# Patient Record
Sex: Female | Born: 1990 | Race: Black or African American | Hispanic: No | Marital: Single | Smoking: Never smoker
Health system: Southern US, Community
[De-identification: ages and names within clinical notes are randomized; demographics above are authoritative.]

---
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
## 2009-04-04 ENCOUNTER — Emergency Department (HOSPITAL_COMMUNITY): Admission: EM | Admit: 2009-04-04 | Discharge: 2009-04-04 | Payer: Self-pay | Admitting: Emergency Medicine

## 2009-07-20 ENCOUNTER — Ambulatory Visit: Payer: Self-pay | Admitting: Family Medicine

## 2009-07-20 ENCOUNTER — Inpatient Hospital Stay (HOSPITAL_COMMUNITY): Admission: AD | Admit: 2009-07-20 | Discharge: 2009-07-22 | Payer: Self-pay | Admitting: Radiology

## 2010-01-01 ENCOUNTER — Emergency Department (HOSPITAL_COMMUNITY): Admission: EM | Admit: 2010-01-01 | Discharge: 2010-01-01 | Payer: Self-pay | Admitting: Emergency Medicine

## 2010-11-01 LAB — COMPREHENSIVE METABOLIC PANEL
CO2: 21 mEq/L (ref 19–32)
Calcium: 8.3 mg/dL — ABNORMAL LOW (ref 8.4–10.5)
Chloride: 108 mEq/L (ref 96–112)
GFR calc Af Amer: >60 mL/min (ref >60)
GFR calc non Af Amer: >60 mL/min (ref >60)
Total Bilirubin: 0.4 mg/dL (ref 0.3–1.2)

## 2010-11-01 LAB — URINALYSIS, ROUTINE W REFLEX MICROSCOPIC
Bilirubin Urine: NEGATIVE
Glucose, UA: NEGATIVE mg/dL
Nitrite: NEGATIVE
Protein, ur: NEGATIVE mg/dL
Urobilinogen, UA: 0.2 mg/dL (ref 0.0–1.0)

## 2010-11-01 LAB — CBC
HCT: 29.9 % — ABNORMAL LOW (ref 36.0–46.0)
HCT: 32.3 % — ABNORMAL LOW (ref 36.0–46.0)
HCT: 32.4 % — ABNORMAL LOW (ref 36.0–46.0)
Hemoglobin: 10.3 g/dL — ABNORMAL LOW (ref 12.0–15.0)
Hemoglobin: 10.5 g/dL — ABNORMAL LOW (ref 12.0–15.0)
Hemoglobin: 9.7 g/dL — ABNORMAL LOW (ref 12.0–15.0)
MCHC: 32 g/dL (ref 30.0–36.0)
MCHC: 32.3 g/dL (ref 30.0–36.0)
MCHC: 32.5 g/dL (ref 30.0–36.0)
MCV: 73.6 fL — ABNORMAL LOW (ref 78.0–100.0)
MCV: 74.3 fL — ABNORMAL LOW (ref 78.0–100.0)
MCV: 74.4 fL — ABNORMAL LOW (ref 78.0–100.0)
Platelets: 232 10*3/uL (ref 150–400)
RBC: 4.35 MIL/uL (ref 3.87–5.11)
RBC: 4.4 MIL/uL (ref 3.87–5.11)
RDW: 15.4 % (ref 11.5–15.5)
RDW: 15.5 % (ref 11.5–15.5)
RDW: 15.6 % — ABNORMAL HIGH (ref 11.5–15.5)
WBC: 9.8 10*3/uL (ref 4.0–10.5)

## 2010-11-01 LAB — DIFFERENTIAL
Basophils Absolute: 0 10*3/uL (ref 0.0–0.1)
Eosinophils Relative: 1 % (ref 0–5)
Lymphocytes Relative: 14 % (ref 12–46)
Lymphs Abs: 1.3 10*3/uL (ref 0.7–4.0)
Monocytes Relative: 9 % (ref 3–12)

## 2010-11-01 LAB — URINE MICROSCOPIC-ADD ON

## 2010-11-01 LAB — TYPE AND SCREEN: ABO/RH(D): O POS

## 2010-11-01 LAB — ABO/RH: ABO/RH(D): O POS

## 2010-11-01 LAB — RAPID URINE DRUG SCREEN, HOSP PERFORMED: Amphetamines: NOT DETECTED

## 2010-11-01 LAB — HIV ANTIBODY (ROUTINE TESTING W REFLEX): HIV: NONREACTIVE

## 2010-11-05 LAB — POCT URINALYSIS DIP (DEVICE)
Ketones, ur: NEGATIVE mg/dL
Nitrite: NEGATIVE
Protein, ur: NEGATIVE mg/dL
Specific Gravity, Urine: 1.01 (ref 1.005–1.030)
Urobilinogen, UA: 0.2 mg/dL (ref 0.0–1.0)
pH: 7 (ref 5.0–8.0)

## 2010-11-05 LAB — POCT PREGNANCY, URINE: Preg Test, Ur: POSITIVE

## 2011-04-28 LAB — CBC
HCT: 24.4 — ABNORMAL LOW
HCT: 26.7 — ABNORMAL LOW
Hemoglobin: 8.9 — ABNORMAL LOW
MCHC: 33.3
MCHC: 33.6
MCV: 83.4
MCV: 83.6
MCV: 83.7
Platelets: 311
RBC: 2.91 — ABNORMAL LOW
RBC: 3.2 — ABNORMAL LOW
RBC: 4.35
WBC: 10.1
WBC: 11.1

## 2011-04-28 LAB — DIFFERENTIAL
Basophils Absolute: 0.1
Eosinophils Relative: 2
Lymphocytes Relative: 18 — ABNORMAL LOW
Lymphs Abs: 2
Neutro Abs: 8
Neutrophils Relative %: 71

## 2011-04-28 LAB — COMPREHENSIVE METABOLIC PANEL
ALT: 23
AST: 20
AST: 52 — ABNORMAL HIGH
Albumin: 2.1 — ABNORMAL LOW
Alkaline Phosphatase: 160 — ABNORMAL HIGH
BUN: 1 — ABNORMAL LOW
BUN: 4 — ABNORMAL LOW
CO2: 25
CO2: 26
Calcium: 6.7 — ABNORMAL LOW
Calcium: 9.2
Chloride: 106
Chloride: 108
Chloride: 109
Creatinine, Ser: 0.54
Creatinine, Ser: 0.66
Glucose, Bld: 90
Glucose, Bld: 93
Potassium: 3.7
Potassium: 4.3
Sodium: 138
Total Bilirubin: 0.4
Total Bilirubin: 0.6
Total Protein: 6.9

## 2011-05-06 LAB — POCT PREGNANCY, URINE: Preg Test, Ur: NEGATIVE

## 2011-08-21 ENCOUNTER — Emergency Department (HOSPITAL_COMMUNITY): Payer: Self-pay

## 2011-08-21 ENCOUNTER — Encounter (HOSPITAL_COMMUNITY): Payer: Self-pay | Admitting: Emergency Medicine

## 2011-08-21 ENCOUNTER — Emergency Department (HOSPITAL_COMMUNITY)
Admission: EM | Admit: 2011-08-21 | Discharge: 2011-08-21 | Disposition: A | Discharge status: Home / Self Care | Payer: Self-pay | Attending: Emergency Medicine | Admitting: Emergency Medicine

## 2011-08-21 DIAGNOSIS — R070 Pain in throat: Secondary | ICD-10-CM | POA: Insufficient documentation

## 2011-08-21 DIAGNOSIS — R300 Dysuria: Secondary | ICD-10-CM | POA: Insufficient documentation

## 2011-08-21 DIAGNOSIS — R319 Hematuria, unspecified: Secondary | ICD-10-CM | POA: Insufficient documentation

## 2011-08-21 DIAGNOSIS — R509 Fever, unspecified: Secondary | ICD-10-CM | POA: Insufficient documentation

## 2011-08-21 DIAGNOSIS — N898 Other specified noninflammatory disorders of vagina: Secondary | ICD-10-CM | POA: Insufficient documentation

## 2011-08-21 DIAGNOSIS — A512 Primary syphilis of other sites: Secondary | ICD-10-CM | POA: Insufficient documentation

## 2011-08-21 DIAGNOSIS — R350 Frequency of micturition: Secondary | ICD-10-CM | POA: Insufficient documentation

## 2011-08-21 DIAGNOSIS — J3489 Other specified disorders of nose and nasal sinuses: Secondary | ICD-10-CM | POA: Insufficient documentation

## 2011-08-21 DIAGNOSIS — N72 Inflammatory disease of cervix uteri: Secondary | ICD-10-CM | POA: Insufficient documentation

## 2011-08-21 DIAGNOSIS — R112 Nausea with vomiting, unspecified: Secondary | ICD-10-CM | POA: Insufficient documentation

## 2011-08-21 DIAGNOSIS — R000 Tachycardia, unspecified: Secondary | ICD-10-CM | POA: Insufficient documentation

## 2011-08-21 LAB — WET PREP, GENITAL
Trich, Wet Prep: NONE SEEN
Yeast Wet Prep HPF POC: NONE SEEN

## 2011-08-21 LAB — URINALYSIS, ROUTINE W REFLEX MICROSCOPIC
Glucose, UA: NEGATIVE mg/dL
Protein, ur: NEGATIVE mg/dL
Specific Gravity, Urine: 1.012 (ref 1.005–1.030)
pH: 6 (ref 5.0–8.0)

## 2011-08-21 LAB — URINE MICROSCOPIC-ADD ON

## 2011-08-21 LAB — URINE CULTURE: Culture  Setup Time: 201301210227

## 2011-08-21 LAB — DIFFERENTIAL
Basophils Absolute: 0.1 10*3/uL (ref 0.0–0.1)
Basophils Relative: 1 % (ref 0–1)
Eosinophils Relative: 1 % (ref 0–5)
Monocytes Absolute: 1 10*3/uL (ref 0.1–1.0)

## 2011-08-21 LAB — COMPREHENSIVE METABOLIC PANEL
AST: 39 U/L — ABNORMAL HIGH (ref 0–37)
Albumin: 3.6 g/dL (ref 3.5–5.2)
Calcium: 8.9 mg/dL (ref 8.4–10.5)
Creatinine, Ser: 0.66 mg/dL (ref 0.50–1.10)
Total Protein: 8 g/dL (ref 6.0–8.3)

## 2011-08-21 LAB — CBC
HCT: 31.2 % — ABNORMAL LOW (ref 36.0–46.0)
MCHC: 33.3 g/dL (ref 30.0–36.0)
MCV: 77.4 fL — ABNORMAL LOW (ref 78.0–100.0)
Platelets: 239 10*3/uL (ref 150–400)
RDW: 13.3 % (ref 11.5–15.5)

## 2011-08-21 MED ORDER — OSELTAMIVIR PHOSPHATE 75 MG PO CAPS: 75.0000 mg | ORAL_CAPSULE | Freq: Every day | ORAL | Status: AC

## 2011-08-21 MED ORDER — PENICILLIN G BENZATHINE 1200000 UNIT/2ML IM SUSP
2.4000 10*6.[IU] | Freq: Once | INTRAMUSCULAR | Status: AC
  Administered 2011-08-21: 2.4 10*6.[IU] via INTRAMUSCULAR
  Filled 2011-08-21: qty 4

## 2011-08-21 MED ORDER — CEPHALEXIN 250 MG PO CAPS
500.0000 mg | ORAL_CAPSULE | Freq: Once | ORAL | Status: AC
  Administered 2011-08-21: 500 mg via ORAL
  Filled 2011-08-21: qty 2

## 2011-08-21 MED ORDER — AZITHROMYCIN 250 MG PO TABS
1000.0000 mg | ORAL_TABLET | Freq: Once | ORAL | Status: AC
  Administered 2011-08-21: 1000 mg via ORAL
  Filled 2011-08-21: qty 4

## 2011-08-21 MED ORDER — MORPHINE SULFATE 4 MG/ML IJ SOLN
4.0000 mg | Freq: Once | INTRAMUSCULAR | Status: AC
  Administered 2011-08-21: 4 mg via INTRAVENOUS
  Filled 2011-08-21: qty 1

## 2011-08-21 MED ORDER — OSELTAMIVIR PHOSPHATE 75 MG PO CAPS
75.0000 mg | ORAL_CAPSULE | ORAL | Status: AC
  Administered 2011-08-21: 75 mg via ORAL
  Filled 2011-08-21: qty 1

## 2011-08-21 MED ORDER — POTASSIUM CHLORIDE CRYS ER 20 MEQ PO TBCR
40.0000 meq | EXTENDED_RELEASE_TABLET | Freq: Once | ORAL | Status: AC
  Administered 2011-08-21: 40 meq via ORAL
  Filled 2011-08-21: qty 2

## 2011-08-21 MED ORDER — LIDOCAINE HCL (PF) 1 % IJ SOLN
INTRAMUSCULAR | Status: AC
  Filled 2011-08-21: qty 5

## 2011-08-21 MED ORDER — CEFTRIAXONE SODIUM 250 MG IJ SOLR
250.0000 mg | Freq: Once | INTRAMUSCULAR | Status: AC
  Administered 2011-08-21: 250 mg via INTRAMUSCULAR
  Filled 2011-08-21: qty 250

## 2011-08-21 MED ORDER — CEPHALEXIN 500 MG PO CAPS: 500.0000 mg | ORAL_CAPSULE | Freq: Three times a day (TID) | ORAL | Status: AC

## 2011-08-21 MED ORDER — ONDANSETRON 4 MG PO TBDP
ORAL_TABLET | ORAL | Status: AC
  Filled 2011-08-21: qty 2

## 2011-08-21 MED ORDER — ONDANSETRON 4 MG PO TBDP
8.0000 mg | ORAL_TABLET | Freq: Once | ORAL | Status: AC
  Administered 2011-08-21: 8 mg via ORAL

## 2011-08-21 MED ORDER — SODIUM CHLORIDE 0.9 % IV BOLUS (SEPSIS)
1000.0000 mL | Freq: Once | INTRAVENOUS | Status: AC
  Administered 2011-08-21: 1000 mL via INTRAVENOUS

## 2011-08-21 MED ORDER — POTASSIUM CHLORIDE CRYS ER 20 MEQ PO TBCR: 40.0000 meq | EXTENDED_RELEASE_TABLET | Freq: Every day | ORAL | Status: AC

## 2011-08-21 MED ORDER — METRONIDAZOLE 500 MG PO TABS
2000.0000 mg | ORAL_TABLET | Freq: Once | ORAL | Status: AC
  Administered 2011-08-21: 2000 mg via ORAL
  Filled 2011-08-21: qty 4

## 2011-08-21 NOTE — ED Provider Notes (Addendum)
History     CSN: [id number]  Arrival date & time 08/21/11  1749   First MD Initiated Contact with Patient 08/21/11 1924      Chief Complaint  Patient presents with  . Sore Throat    (Consider location/radiation/quality/duration/timing/severity/associated sxs/prior treatment) HPI Patient is a 21 year old female who presents today complaining of dysuria as well as sore throat and vaginal discharge. Patient is also had fevers and chills. She denies any cough or myalgias. Patient is tachycardic to 118 on presentation. She is afebrile here patient has had a temperature as high as 104 at home last night. Patient does mention that she could have possibility of a sexual transmitted disease. She also notes that she has noted bumps on her labia. Her symptoms began on Monday as far as her operative course symptoms. Blood in her urine, her vaginal lesions, and vaginal discharge began 2 days ago. Patient does not think she can be pregnant as she is on Depo.  She has not missed any of her periods. She denies any constipation or diarrhea. She has had nausea and an episode of vomiting. She denies any abdominal pain. Patient says her pain as a 5/10. She has noted the she has had some streaking of blood in her saliva but she has coughed up secretions. There are no other associated or modifying factors  History reviewed. No pertinent past medical history.  History reviewed. No pertinent past surgical history.  History reviewed. No pertinent family history.  History  Substance Use Topics  . Smoking status: Never Smoker   . Smokeless tobacco: Not on file  . Alcohol Use: No    OB History    Grav Para Term Preterm Abortions TAB SAB Ect Mult Living                  Review of Systems  Constitutional: Positive for fever and chills.  HENT: Positive for congestion and sore throat.   Eyes: Negative.   Respiratory: Negative.   Cardiovascular: Negative.   Gastrointestinal: Positive for nausea and  vomiting.  Genitourinary: Positive for frequency, hematuria, vaginal discharge and genital sores. Negative for dysuria and flank pain.  Musculoskeletal: Negative.   Skin: Negative.   Neurological: Negative.   Hematological: Negative.   Psychiatric/Behavioral: Negative.   All other systems reviewed and are negative.    Allergies  Review of patient's allergies indicates no known allergies.  Home Medications  No current outpatient prescriptions on file.  BP 128/78  Pulse 116  Temp(Src) 98.4 F (36.9 C) (Oral)  Resp 16  SpO2 98%  Physical Exam  Nursing note and vitals reviewed. Constitutional: She is oriented to person, place, and time. She appears well-developed and well-nourished. No distress.  HENT:  Head: Normocephalic and atraumatic.  Mouth/Throat: Posterior oropharyngeal edema and posterior oropharyngeal erythema present. No oropharyngeal exudate.  Eyes: Conjunctivae and EOM are normal. Pupils are equal, round, and reactive to light.  Neck: Normal range of motion.  Cardiovascular: Regular rhythm.  Tachycardia present.   Pulmonary/Chest: Effort normal and breath sounds normal. No respiratory distress. She has no wheezes. She has no rales.  Abdominal: Soft. Bowel sounds are normal. She exhibits no distension. There is no tenderness. There is no rebound and no guarding.  Genitourinary: Vaginal discharge found.       External vaginal exam is significant for numerous chancres noted over the labia bilaterally. These are raised lesions with umbilicated center. They're painless. They are in no way vesicular. Internal exam with speculum indicates friable  cervix with yellowish green discharge in moderate amounts. Patient has cervical motion tenderness but no adnexal motion tenderness noted.  Musculoskeletal: Normal range of motion. She exhibits no edema and no tenderness.  Neurological: She is alert and oriented to person, place, and time. No cranial nerve deficit. She exhibits normal  muscle tone. Coordination normal.  Skin: Skin is warm and dry. No rash noted.  Psychiatric: She has a normal mood and affect.    ED Course  Procedures (including critical care time)  Labs Reviewed  CBC - Abnormal; Notable for the following:    Hemoglobin 10.4 (*)    HCT 31.2 (*)    MCV 77.4 (*)    MCH 25.8 (*)    All other components within normal limits  URINALYSIS, ROUTINE W REFLEX MICROSCOPIC - Abnormal; Notable for the following:    Color, Urine AMBER (*) BIOCHEMICALS MAY BE AFFECTED BY COLOR   APPearance CLOUDY (*)    Hgb urine dipstick MODERATE (*)    Urobilinogen, UA 2.0 (*)    Leukocytes, UA LARGE (*)    All other components within normal limits  WET PREP, GENITAL - Abnormal; Notable for the following:    WBC, Wet Prep HPF POC MODERATE (*)    All other components within normal limits  URINE MICROSCOPIC-ADD ON - Abnormal; Notable for the following:    Bacteria, UA MANY (*)    All other components within normal limits  DIFFERENTIAL  COMPREHENSIVE METABOLIC PANEL  LIPASE, BLOOD  POCT URINE PREGNANCY  GC/CHLAMYDIA PROBE AMP, GENITAL  RPR  HIV ANTIBODY (ROUTINE TESTING)  PREGNANCY, URINE   No results found.   1. Cervicitis   2. Syphilis, primary, other       MDM  Patient was evaluated by myself. Sexual history was completed once patient was in a room separate from her mother. Her initial workup included a CBC, as well as CMP and lipase given that she had nausea vomiting and fevers. Chest x-ray was also ordered given her fevers.  She also had a urine pregnancy test as well as a urinalysis. Pelvic exam was also performed. Patient was given 4 mg of morphine for her pain as well as normal saline IV bolus for tachycardia.  Patient did have oropharyngeal erythema and edema without exudate. Patient had pelvic exam performed. This was concerning for primary syphilis as well as cervicitis. Patient was notified of results. RPR and HIV testing was added to patient's labs.  Patient was treated for cervicitis with Rocephin 250 mg IM, azithromycin 1 g by mouth, and Flagyl 2 g by mouth. She was also given penicillin G 2.4 million units IM. This would also treat any possible streptococcal pharyngitis the patient might have. There is no need to perform swab today.  At this time the remainder of patient's laboratory workup is pending. She definitely has cervicitis as well as clinical presentation of primary syphilis. She's been notified of this and notified to contact her partners. RPR and HIV are pending. Patient will have infectious disease clinic listed on her followup instructions. Remainder of laboratory workup is pending at this time. Urinalysis results are likely related to patient's vaginal complaints. I think patient's hematuria was probably actually secondary to friable cervix and with contamination of her urine with vaginal secretions. In the interest of completeness as patient has minimal symptoms a urine culture will be sent.  Patient will be treated with keflex for possible UTI and discharged with Rx for this.  CMP showed minimal transaminitis and hypokalemia.  KCl was replaced orally.  Patient also was treated with tamiflu for her URI symptoms given that she is also being tested for HIV today and could be a high-risk patient for flu.  Lipase and pregnancy are negative and there is no leukocytosis.  She will continue to be treated symptomatically. Her care will be signed out to physician assistant. Report will also be given to oncoming attending.         Cyndra Numbers, MD 08/21/11 6045  Cyndra Numbers, MD 08/21/11 4098  Cyndra Numbers, MD 08/22/11 1191

## 2011-08-21 NOTE — ED Provider Notes (Signed)
History     CSN: [id number]  Arrival date & time 08/21/11  1749   First MD Initiated Contact with Patient 08/21/11 1924      Chief Complaint  Patient presents with  . Sore Throat    (Consider location/radiation/quality/duration/timing/severity/associated sxs/prior treatment) HPI  History reviewed. No pertinent past medical history.  History reviewed. No pertinent past surgical history.  History reviewed. No pertinent family history.  History  Substance Use Topics  . Smoking status: Never Smoker   . Smokeless tobacco: Not on file  . Alcohol Use: No    OB History    Grav Para Term Preterm Abortions TAB SAB Ect Mult Living                  Review of Systems  Allergies  Review of patient's allergies indicates no known allergies.  Home Medications  No current outpatient prescriptions on file.  BP 118/74  Pulse 112  Temp(Src) 98.4 F (36.9 C) (Oral)  Resp 16  SpO2 99%  Physical Exam  ED Course  Procedures (including critical care time)  Labs Reviewed  CBC - Abnormal; Notable for the following:    Hemoglobin 10.4 (*)    HCT 31.2 (*)    MCV 77.4 (*)    MCH 25.8 (*)    All other components within normal limits  COMPREHENSIVE METABOLIC PANEL - Abnormal; Notable for the following:    Potassium 3.0 (*)    AST 39 (*)    ALT 49 (*)    All other components within normal limits  URINALYSIS, ROUTINE W REFLEX MICROSCOPIC - Abnormal; Notable for the following:    Color, Urine AMBER (*) BIOCHEMICALS MAY BE AFFECTED BY COLOR   APPearance CLOUDY (*)    Hgb urine dipstick MODERATE (*)    Urobilinogen, UA 2.0 (*)    Leukocytes, UA LARGE (*)    All other components within normal limits  WET PREP, GENITAL - Abnormal; Notable for the following:    WBC, Wet Prep HPF POC MODERATE (*)    All other components within normal limits  URINE MICROSCOPIC-ADD ON - Abnormal; Notable for the following:    Bacteria, UA MANY (*)    All other components within normal limits    DIFFERENTIAL  LIPASE, BLOOD  PREGNANCY, URINE  POCT URINE PREGNANCY  GC/CHLAMYDIA PROBE AMP, GENITAL  RPR  HIV ANTIBODY (ROUTINE TESTING)  URINE CULTURE   Dg Chest 2 View  08/21/2011  *RADIOLOGY REPORT*  Clinical Data: Cough.  CHEST - 2 VIEW  Comparison: None.  Findings: Lungs are clear.  No pneumothorax or effusion.  Heart size normal.  No focal bony abnormality.  IMPRESSION: Normal chest.  Original Report Authenticated By: Bernadene Bell. D'ALESSIO, M.D.     1. Cervicitis   2. Syphilis, primary, other       MDM  No further labs pending - patient reports feeling better - will discharge home with treatment management outlined by Dr. Randolm Idol C. [place], [place] 08/21/11 2240

## 2011-08-21 NOTE — ED Notes (Signed)
C/o sore throat, fever, and chills since Monday.  Also reports hematuria that started today.

## 2011-08-22 LAB — HIV ANTIBODY (ROUTINE TESTING W REFLEX): HIV: NONREACTIVE

## 2011-08-22 NOTE — ED Provider Notes (Signed)
Medical screening examination/treatment/procedure(s) were conducted as a shared visit with non-physician practitioner(s) and myself.  I personally evaluated the patient during the encounter  Cyndra Numbers, MD 08/22/11 0900

## 2011-08-23 LAB — GC/CHLAMYDIA PROBE AMP, GENITAL
Chlamydia, DNA Probe: NEGATIVE
GC Probe Amp, Genital: NEGATIVE

## 2011-08-24 NOTE — ED Notes (Signed)
+   Urine. Patient treated with Keflex. Sensitive to same. Chart appended per protocol MD.

## 2013-03-05 IMAGING — CR DG CHEST 2V
2 series · 2 of 2 positions shown · non-contrast
Comparison: None.

CLINICAL DATA: Cough.

CHEST - 2 VIEW

[w chest pa]
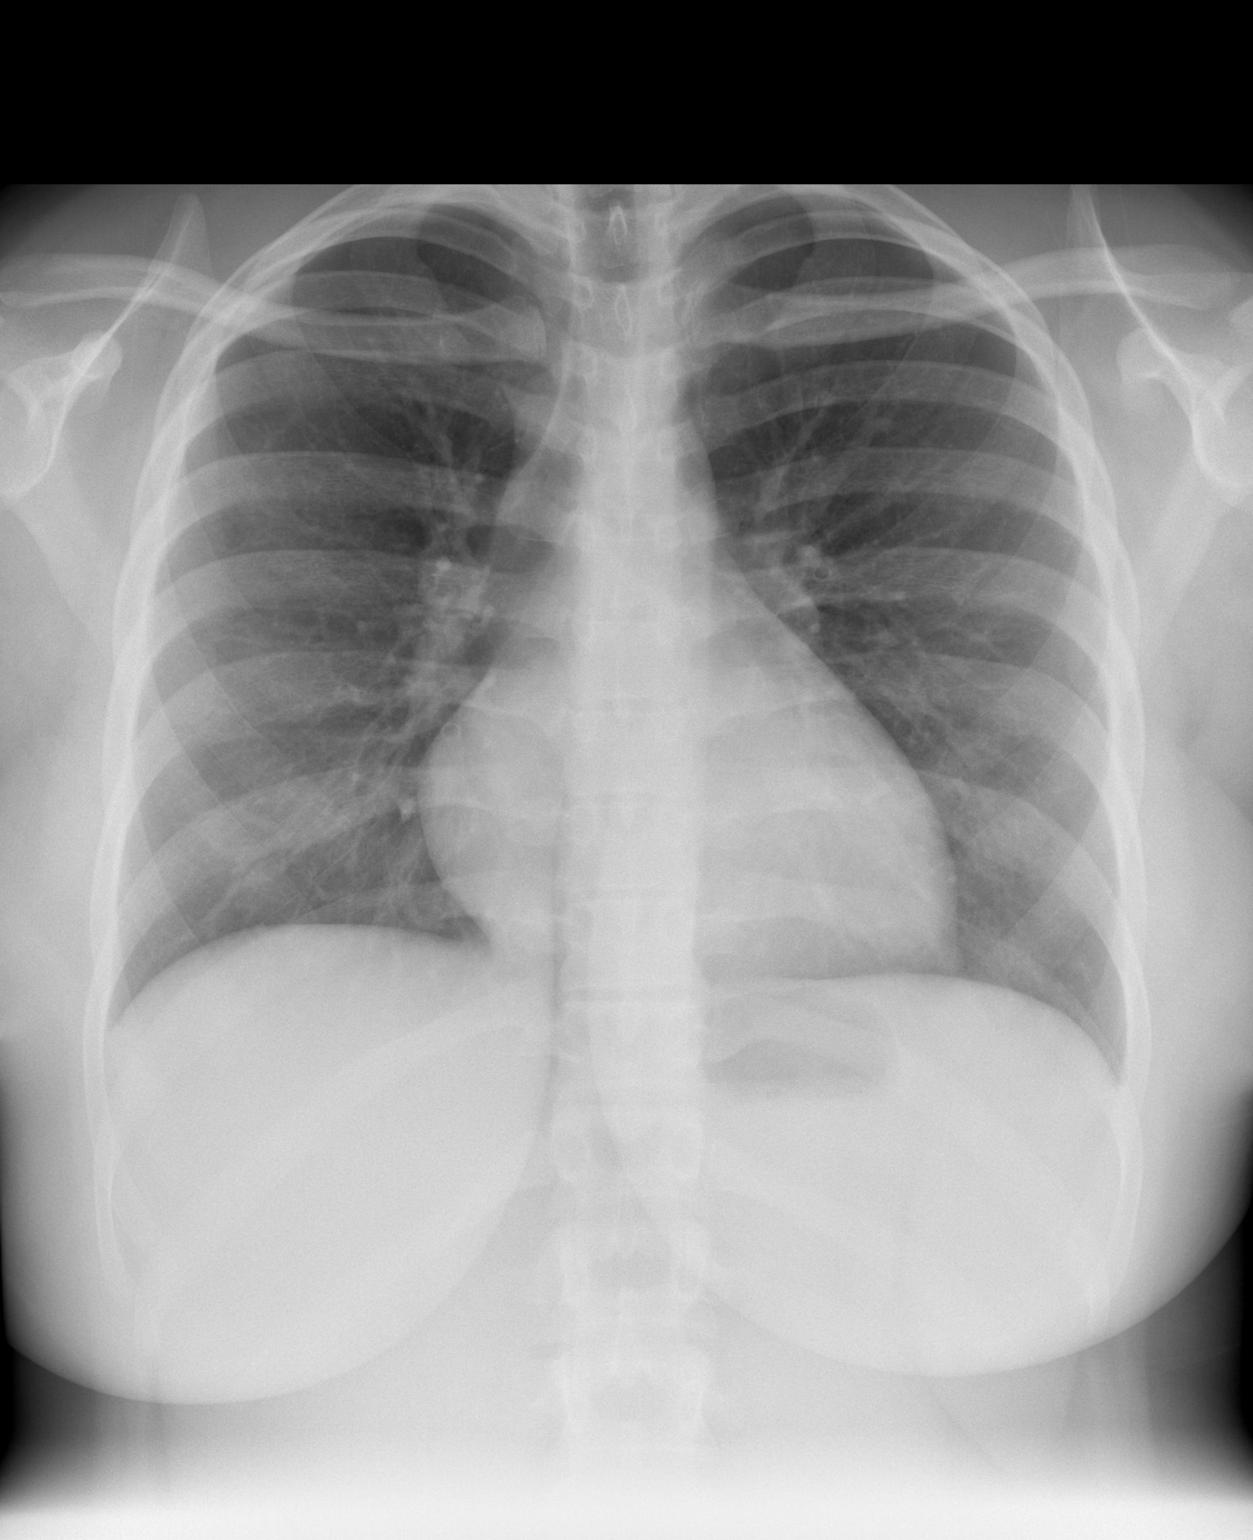

[w chest lat *]
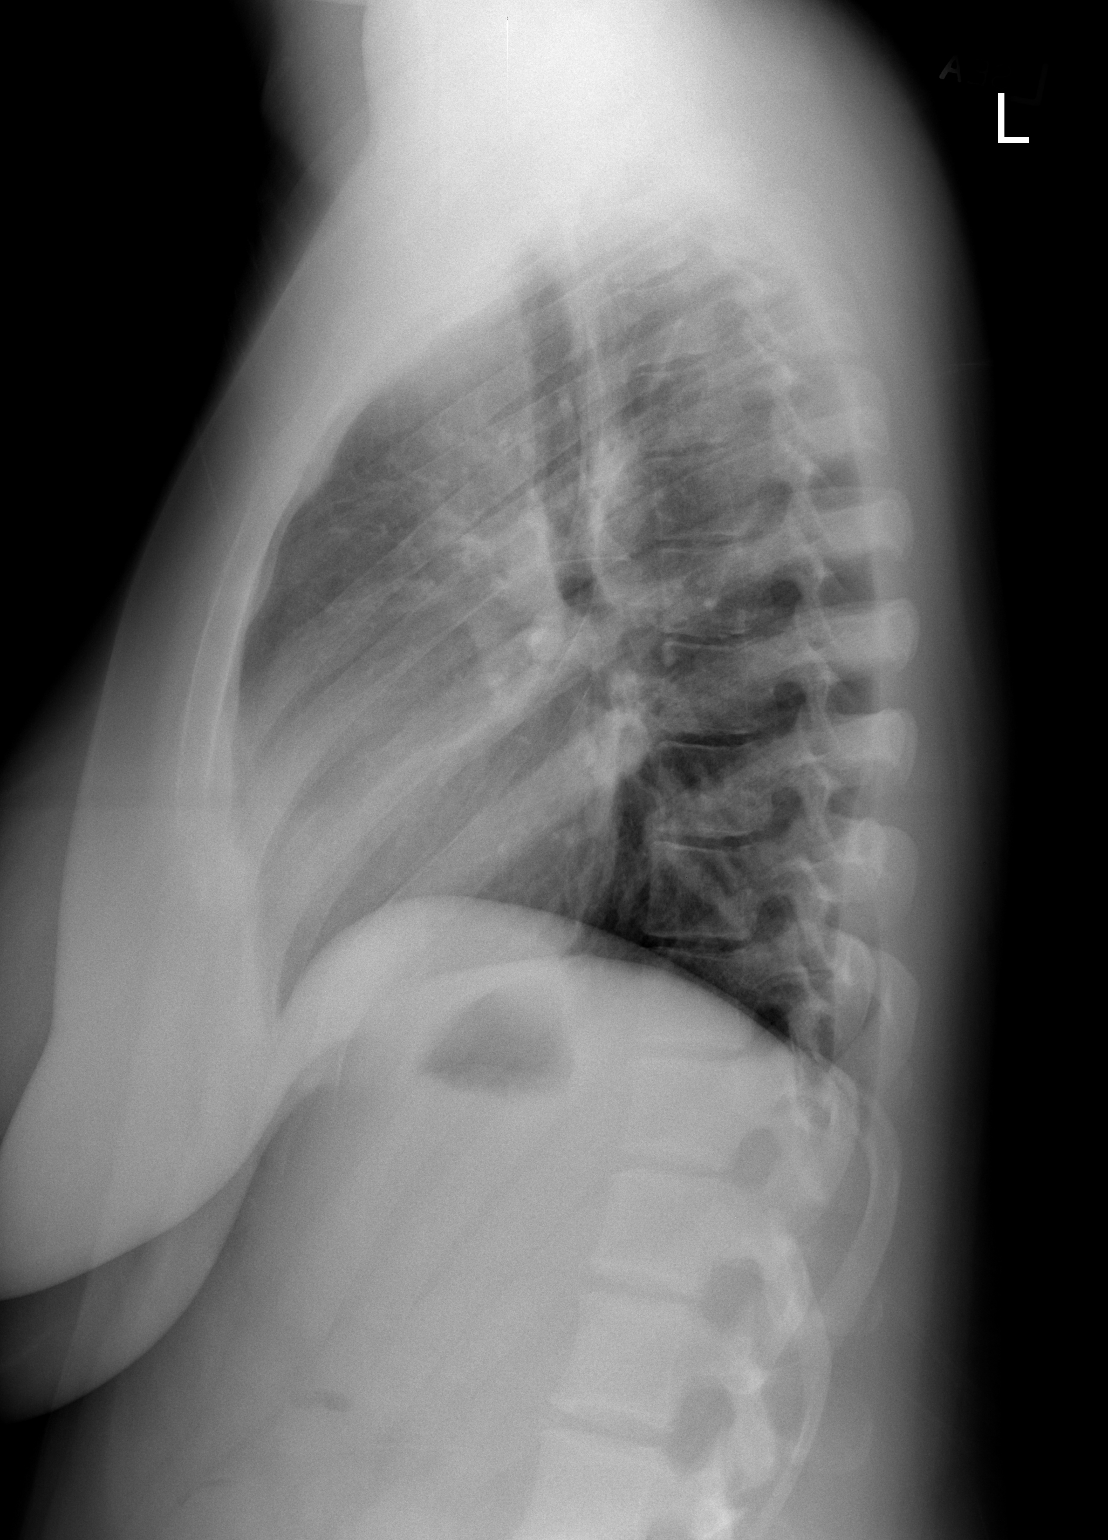

[2 of 2 positions shown; findings below may reference images not displayed]

FINDINGS: Lungs are clear.  No pneumothorax or effusion.  Heart
size normal.  No focal bony abnormality.
IMPRESSION: Normal chest.

## 2013-04-12 ENCOUNTER — Ambulatory Visit: Payer: Medicaid Other | Admitting: Neurology

## 2014-05-12 ENCOUNTER — Emergency Department (HOSPITAL_COMMUNITY)
Admission: EM | Admit: 2014-05-12 | Discharge: 2014-05-12 | Disposition: A | Discharge status: Home / Self Care | Payer: BLUE CROSS/BLUE SHIELD | Source: Home / Self Care

## 2014-05-26 ENCOUNTER — Ambulatory Visit: Payer: BLUE CROSS/BLUE SHIELD | Admitting: Family

## 2014-05-26 DIAGNOSIS — Z0289 Encounter for other administrative examinations: Secondary | ICD-10-CM

## 2014-09-01 ENCOUNTER — Ambulatory Visit
Admission: RE | Admit: 2014-09-01 | Discharge: 2014-09-01 | Disposition: A | Discharge status: Home / Self Care | Payer: BC Managed Care – PPO | Source: Ambulatory Visit | Attending: Family Medicine | Admitting: Family Medicine

## 2014-09-01 ENCOUNTER — Other Ambulatory Visit: Payer: Self-pay | Admitting: Family Medicine

## 2014-09-01 DIAGNOSIS — M79672 Pain in left foot: Secondary | ICD-10-CM

## 2014-09-01 DIAGNOSIS — R52 Pain, unspecified: Secondary | ICD-10-CM

## 2014-09-30 ENCOUNTER — Ambulatory Visit: Payer: BLUE CROSS/BLUE SHIELD | Admitting: Family

## 2019-05-13 ENCOUNTER — Other Ambulatory Visit: Payer: Self-pay

## 2019-05-13 ENCOUNTER — Emergency Department (HOSPITAL_COMMUNITY)
Admission: EM | Admit: 2019-05-13 | Discharge: 2019-05-13 | Disposition: A | Discharge status: Home / Self Care | Payer: BC Managed Care – PPO | Attending: Emergency Medicine | Admitting: Emergency Medicine

## 2019-05-13 ENCOUNTER — Encounter (HOSPITAL_COMMUNITY): Payer: Self-pay | Admitting: Emergency Medicine

## 2019-05-13 DIAGNOSIS — J029 Acute pharyngitis, unspecified: Secondary | ICD-10-CM | POA: Diagnosis present

## 2019-05-13 LAB — GROUP A STREP BY PCR: Group A Strep by PCR: NOT DETECTED

## 2019-05-13 MED ORDER — DEXAMETHASONE 10 MG/ML FOR PEDIATRIC ORAL USE
10.0000 mg | Freq: Once | INTRAMUSCULAR | Status: AC
  Administered 2019-05-13: 17:00:00 10 mg via ORAL
  Filled 2019-05-13: qty 1

## 2019-05-13 MED ORDER — IBUPROFEN 100 MG/5ML PO SUSP
800.0000 mg | Freq: Once | ORAL | Status: AC
  Administered 2019-05-13: 17:00:00 800 mg via ORAL
  Filled 2019-05-13: qty 40

## 2019-05-13 MED ORDER — DEXAMETHASONE 1 MG/ML PO CONC: 10.0000 mg | Freq: Once | ORAL | Status: DC

## 2019-05-13 NOTE — Discharge Instructions (Signed)
Your strep test today is negative.  Your sore throat is likely caused by a virus. You can take Motrin and Tylenol for pain.  Be sure to drink plenty of fluids and stay hydrated. You can also gargle warm salt water or drink warm tea with honey. You were given a dose of Decadron in the emergency room.  This is a steroid and will help with pain in your throat over the next 24 hours.

## 2019-05-13 NOTE — ED Provider Notes (Signed)
[facility]-EMERGENCY DEPT Provider Note   CSN: [id number] Arrival date & time: 05/13/19  1206     History   Chief Complaint Chief Complaint  Patient presents with  . Sore Throat    HPI Alexa Lang is a 28 y.o. female.     28 year old female presents with complaint of sore throat x2 days, pain worse with swallowing.  Denies fevers, headache, ear pain, cough, congestion or any other complaints or concerns.  No known sick contacts.     History reviewed. No pertinent past medical history.  There are no active problems to display for this patient.   History reviewed. No pertinent surgical history.   OB History   No obstetric history on file.      Home Medications    Prior to Admission medications   Medication Sig Start Date End Date Taking? Authorizing Provider  potassium chloride SA (K-DUR,KLOR-CON) 20 MEQ tablet Take 2 tablets (40 mEq total) by mouth daily. 08/21/11 08/20/12  Cherrie Distance, PA-C    Family History No family history on file.  Social History Social History   Tobacco Use  . Smoking status: Never Smoker  Substance Use Topics  . Alcohol use: No  . Drug use: No     Allergies   Patient has no known allergies.   Review of Systems Review of Systems  Constitutional: Negative for fever.  HENT: Positive for sore throat. Negative for congestion, ear pain, sinus pressure, sinus pain and sneezing.   Respiratory: Negative for cough.   Gastrointestinal: Negative for nausea and vomiting.  Skin: Negative for rash and wound.  Allergic/Immunologic: Negative for immunocompromised state.  Neurological: Negative for headaches.  Hematological: Positive for adenopathy.  Psychiatric/Behavioral: Negative for confusion.  All other systems reviewed and are negative.    Physical Exam Updated Vital Signs BP (!) 159/93 (BP Location: Left Arm)   Pulse (!) 106   Temp 98.9 F (37.2 C) (Oral)   Resp 18   SpO2 99%   Physical  Exam Vitals signs and nursing note reviewed.  Constitutional:      General: She is not in acute distress.    Appearance: She is well-developed. She is not diaphoretic.  HENT:     Head: Normocephalic and atraumatic.     Nose: No congestion.     Mouth/Throat:     Mouth: Mucous membranes are moist.     Pharynx: Uvula midline. Posterior oropharyngeal erythema and uvula swelling present.     Tonsils: No tonsillar exudate or tonsillar abscesses. 2+ on the right. 2+ on the left.  Eyes:     Conjunctiva/sclera: Conjunctivae normal.  Cardiovascular:     Rate and Rhythm: Normal rate and regular rhythm.     Heart sounds: Normal heart sounds.  Pulmonary:     Effort: Pulmonary effort is normal.  Lymphadenopathy:     Cervical: Cervical adenopathy present.  Skin:    General: Skin is warm and dry.  Neurological:     Mental Status: She is alert and oriented to person, place, and time.  Psychiatric:        Behavior: Behavior normal.      ED Treatments / Results  Labs (all labs ordered are listed, but only abnormal results are displayed) Labs Reviewed  GROUP A STREP BY PCR    EKG None  Radiology No results found.  Procedures Procedures (including critical care time)  Medications Ordered in ED Medications  ibuprofen (ADVIL) 100 MG/5ML suspension 800 mg (800 mg Oral  Given 05/13/19 1729)  dexamethasone (DECADRON) 10 MG/ML injection for Pediatric ORAL use 10 mg (10 mg Oral Given 05/13/19 1729)     Initial Impression / Assessment and Plan / ED Course  I have reviewed the triage vital signs and the nursing notes.  Pertinent labs & imaging results that were available during my care of the patient were reviewed by me and considered in my medical decision making (see chart for details).  Clinical Course as of May 12 1842  Mon May 12, 4985  49 28 year old female presents with complaint of sore throat, no other symptoms.  On exam patient has erythema to the tonsils and posterior  oropharynx as well as uvula.  No exudate.  Patient does have mildly tender cervical lymphadenopathy.  Rapid strep test is negative.  Patient was given ibuprofen and Decadron while in the ER.  Advised patient to hydrate at home, take Motrin and Tylenol and follow-up with PCP if symptoms persist.   [LM]    Clinical Course User Index [LM] Tacy Learn, PA-C      Final Clinical Impressions(s) / ED Diagnoses   Final diagnoses:  Acute pharyngitis, unspecified etiology    ED Discharge Orders    None       Tacy Learn, PA-C 05/13/19 1843    Dorie Rank, MD 05/14/19 [phone]

## 2019-05-13 NOTE — ED Triage Notes (Signed)
Pt complaint of sore throat since Friday; denies other symptoms.

## 2019-06-08 ENCOUNTER — Encounter (HOSPITAL_COMMUNITY): Payer: Self-pay

## 2019-06-08 ENCOUNTER — Ambulatory Visit (HOSPITAL_COMMUNITY)
Admission: EM | Admit: 2019-06-08 | Discharge: 2019-06-08 | Disposition: A | Discharge status: Home / Self Care | Payer: BC Managed Care – PPO | Attending: Family Medicine | Admitting: Family Medicine

## 2019-06-08 ENCOUNTER — Other Ambulatory Visit: Payer: Self-pay

## 2019-06-08 DIAGNOSIS — M25562 Pain in left knee: Secondary | ICD-10-CM | POA: Diagnosis not present

## 2019-06-08 MED ORDER — DICLOFENAC SODIUM 75 MG PO TBEC: 75.0000 mg | DELAYED_RELEASE_TABLET | Freq: Two times a day (BID) | ORAL | 0 refills | Status: AC

## 2019-06-08 NOTE — ED Triage Notes (Signed)
Patient presents to Urgent Care with complaints of sudden random left knee pain since about 2-3 days ago. Patient reports no known injury, has been taking ibuprofen and tylenol w/ no improvement.

## 2019-06-10 NOTE — ED Provider Notes (Signed)
[place]   [id number] 06/08/19 Arrival Time: 5400  ASSESSMENT & PLAN:  1. Acute pain of left knee      No indication for imaging of knee at this time. Discussed.  Begin trial of: Meds ordered this encounter  Medications  . diclofenac (VOLTAREN) 75 MG EC tablet    Sig: Take 1 tablet (75 mg total) by mouth 2 (two) times daily.    Dispense:  14 tablet    Refill:  0    Orders Placed This Encounter  Procedures  . Apply knee sleeve  WBAT.  Recommend: Follow-up Information    [place].   Why: If worsening or failing to improve as anticipated. Contact information: [address] [place] [phone]          Reviewed expectations re: course of current medical issues. Questions answered. Outlined signs and symptoms indicating need for more acute intervention. Patient verbalized understanding. After Visit Summary given.  SUBJECTIVE: History from: patient. SWAYZIE CHOATE is a 28 y.o. female who reports intermittent mild to moderate pain of her left knee; described as aching; without radiation. Onset: gradual. First noted: several days ago. Injury/trama: no. Symptoms have waxed and waned since beginning. Aggravating factors: certain movements and weight bearing. Alleviating factors: rest. Associated symptoms: none reported. Extremity sensation changes or weakness: none. Self treatment: acetaminophen, NSAID, with minimal relief.  History of similar: no.  History reviewed. No pertinent surgical history.   ROS: As per HPI. All other systems negative.    OBJECTIVE:  Vitals:   06/08/19 1612  BP: 137/87  Pulse: 84  Resp: 16  Temp: 98.3 F (36.8 C)  TempSrc: Oral  SpO2: 100%    General appearance: alert; no distress HEENT: [place]; AT Neck: supple with FROM Resp: unlabored respirations Extremities: . LLE: warm with well perfused appearance; poorly localized mild tenderness over left  medial knee along MCL distribution; without gross deformities; swelling: none; bruising: none; knee ROM: normal without reported discomfort CV: brisk extremity capillary refill of LLE; 2+ DP/PT pulses of LLE; no calf swelling Skin: warm and dry; no visible rashes Neurologic: gait normal; normal reflexes of LLE; normal sensation of LLE; normal strength of LLE Psychological: alert and cooperative; normal mood and affect   No Known Allergies  PMH: "Healthy".  Social History   Socioeconomic History  . Marital status: Single    Spouse name: Not on file  . Number of children: Not on file  . Years of education: Not on file  . Highest education level: Not on file  Occupational History  . Not on file  Social Needs  . Financial resource strain: Not on file  . Food insecurity    Worry: Not on file    Inability: Not on file  . Transportation needs    Medical: Not on file    Non-medical: Not on file  Tobacco Use  . Smoking status: Never Smoker  . Smokeless tobacco: Never Used  Substance and Sexual Activity  . Alcohol use: No  . Drug use: No  . Sexual activity: Not on file  Lifestyle  . Physical activity    Days per week: Not on file    Minutes per session: Not on file  . Stress: Not on file  Relationships  . Social Herbalist on phone: Not on file    Gets together: Not on file    Attends religious service: Not on file  Active member of club or organization: Not on file    Attends meetings of clubs or organizations: Not on file    Relationship status: Not on file  Other Topics Concern  . Not on file  Social History Narrative  . Not on file   Family History  Problem Relation Age of Onset  . Hypertension Mother   . Hypertension Father    History reviewed. No pertinent surgical history.    Mardella Layman, MD 06/10/19 [phone]

## 2022-01-26 ENCOUNTER — Telehealth: Payer: Self-pay | Admitting: Nurse Practitioner

## 2022-01-26 ENCOUNTER — Ambulatory Visit: Payer: BC Managed Care – PPO | Admitting: Nurse Practitioner

## 2022-01-26 NOTE — Telephone Encounter (Signed)
6.28.23 no show letter sent

## 2022-03-04 NOTE — Telephone Encounter (Signed)
1st no show, fee waived, letter sent
